# Patient Record
Sex: Male | Born: 1970 | Race: White | Hispanic: No | Marital: Married | Smoking: Never smoker
Health system: Southern US, Community
[De-identification: ages and names within clinical notes are randomized; demographics above are authoritative.]

## PROBLEM LIST (undated history)

## (undated) DIAGNOSIS — I10 Essential (primary) hypertension: Secondary | ICD-10-CM

---

## 2012-12-11 ENCOUNTER — Ambulatory Visit: Payer: Self-pay | Admitting: Emergency Medicine

## 2013-01-04 ENCOUNTER — Encounter: Payer: Self-pay | Admitting: Family Medicine

## 2013-01-07 ENCOUNTER — Encounter: Payer: Self-pay | Admitting: Family Medicine

## 2014-01-15 ENCOUNTER — Ambulatory Visit: Payer: Self-pay | Admitting: Family Medicine

## 2014-11-21 ENCOUNTER — Ambulatory Visit
Admit: 2014-11-21 | Disposition: A | Discharge status: Home / Self Care | Payer: Self-pay | Attending: Internal Medicine | Admitting: Internal Medicine

## 2016-11-01 IMAGING — CR DG CHEST 2V
2 series · 2 of 2 positions shown · non-contrast
Comparison: None.

CLINICAL DATA: Chest tightness with left upper chest and arm pain
today. Initial encounter.

EXAM:
CHEST  2 VIEW

[chest pa]
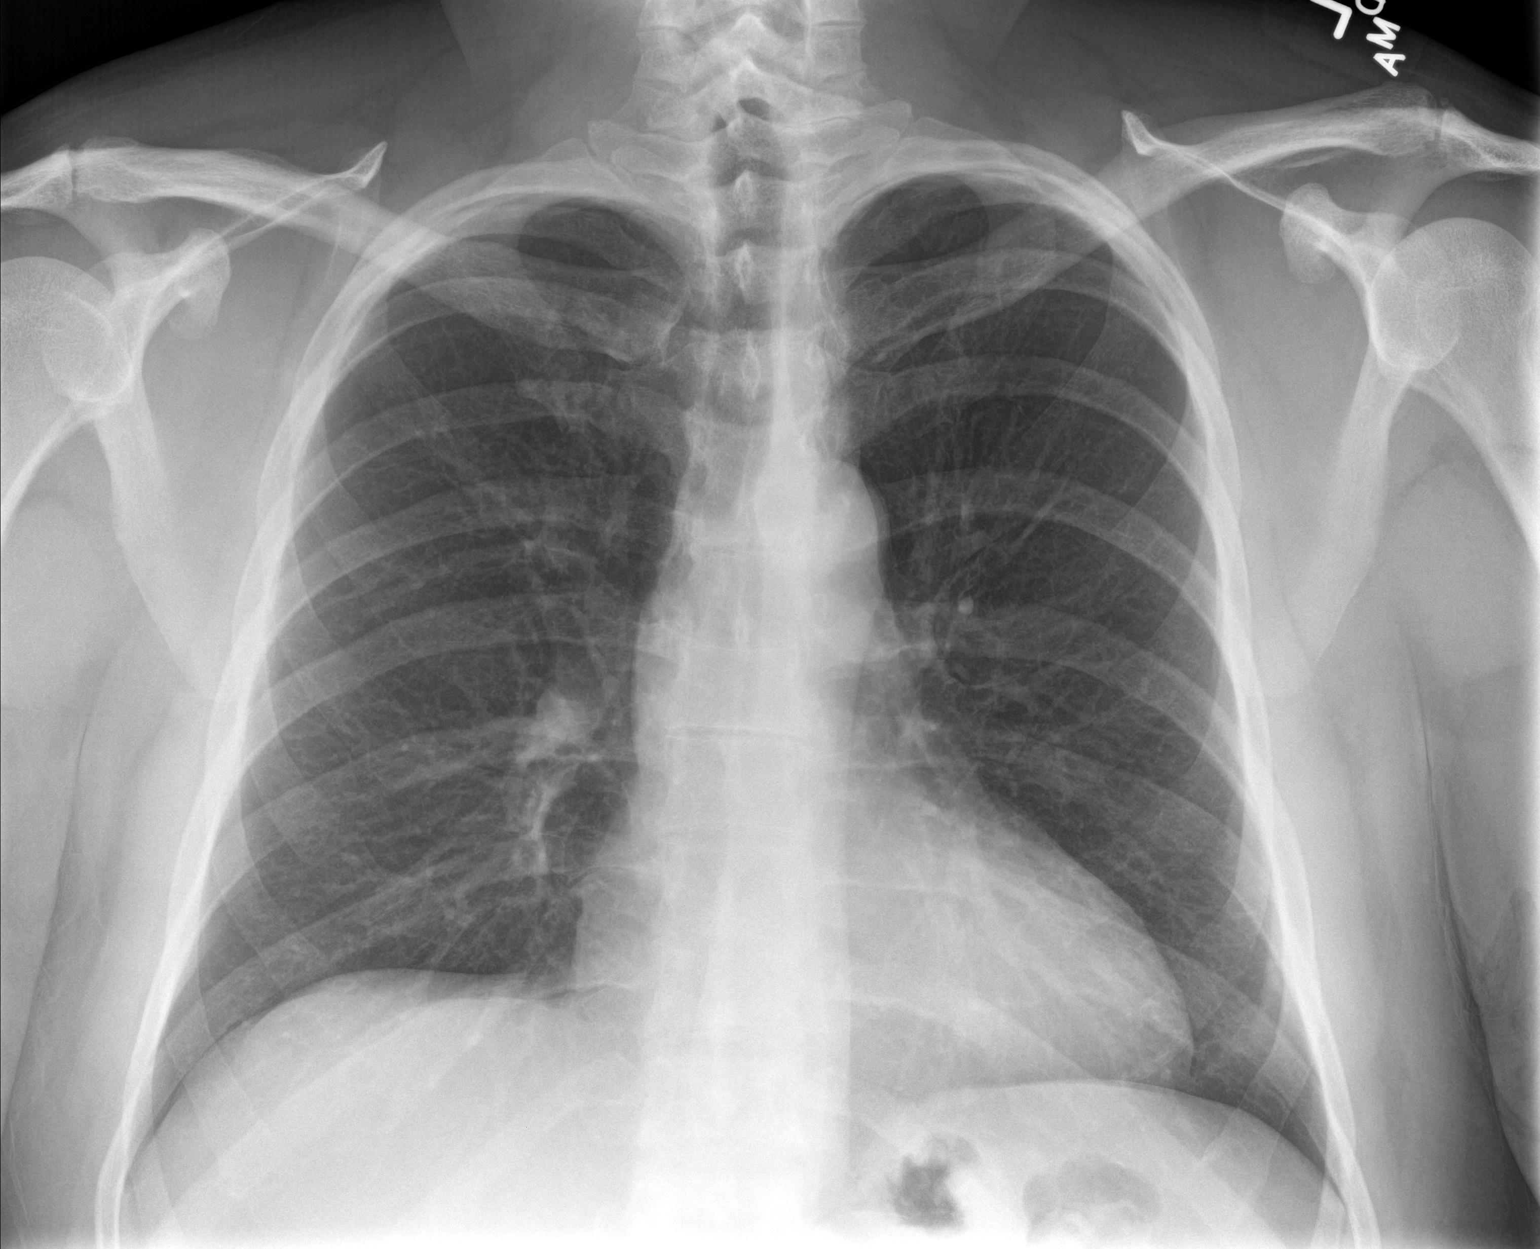

[chest lat]
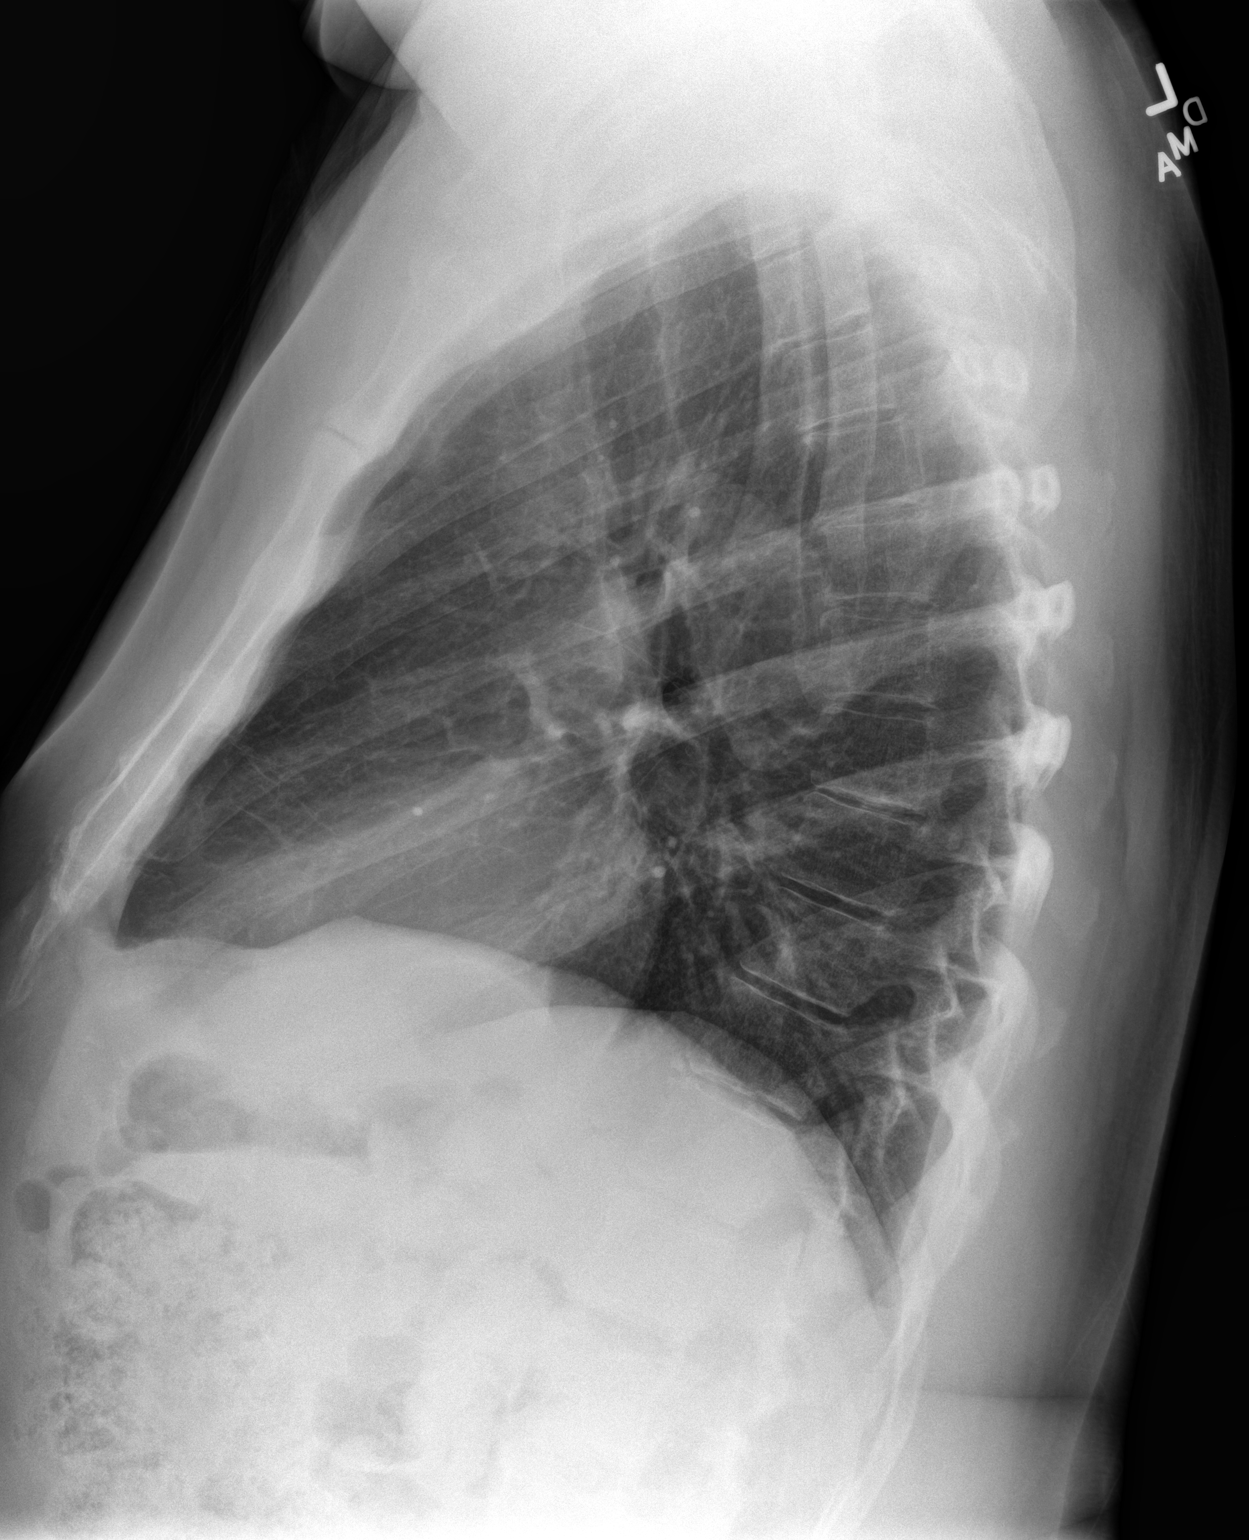

[2 of 2 positions shown; findings below may reference images not displayed]

FINDINGS: The heart size and mediastinal contours are normal. The lungs are
clear. There is no pleural effusion or pneumothorax. No acute
osseous findings are identified.
IMPRESSION: No active cardiopulmonary process.

## 2018-12-28 ENCOUNTER — Other Ambulatory Visit: Payer: Self-pay

## 2018-12-28 ENCOUNTER — Ambulatory Visit
Admission: EM | Admit: 2018-12-28 | Discharge: 2018-12-28 | Disposition: A | Discharge status: Home / Self Care | Payer: BLUE CROSS/BLUE SHIELD | Attending: Family Medicine | Admitting: Family Medicine

## 2018-12-28 ENCOUNTER — Encounter: Payer: Self-pay | Admitting: Emergency Medicine

## 2018-12-28 DIAGNOSIS — I10 Essential (primary) hypertension: Secondary | ICD-10-CM

## 2018-12-28 DIAGNOSIS — H1032 Unspecified acute conjunctivitis, left eye: Secondary | ICD-10-CM | POA: Diagnosis not present

## 2018-12-28 HISTORY — DX: Essential (primary) hypertension: I10

## 2018-12-28 MED ORDER — POLYMYXIN B-TRIMETHOPRIM 10000-0.1 UNIT/ML-% OP SOLN: 1.0000 [drp] | OPHTHALMIC | 0 refills | Status: AC

## 2018-12-28 NOTE — ED Provider Notes (Signed)
[address], [address] [place], [place] [address] [phone]   Name: Fred Dunn DOB: November 11, 1970 MRN: [id number] CSN: [id number] PCP: Patient, No Pcp Per  Arrival date and time:  12/28/18 1123  Chief Complaint:  Eye Problem  NOTE: Prior to seeing the patient today, I have reviewed the triage nursing documentation and vital signs. Clinical staff has updated patient's PMH/PSHx, current medication list, and drug allergies/intolerances to ensure comprehensive history available to assist in medical decision making.   History:   HPI: Fred Dunn is a 48 y.o. male who presents today with complaints of LEFT eye pain and drainage that began earlier today. Patient notes that his eye is pruritic and he has been experiencing increased tearing. No photophobia. Patient woke this morning with some mild yellow exudative crusting. No visual changes. No eye injury. Patient endorses rubbing his eye more as of late. No recent sinus or respiratory issues. Patient describes his eye as being "tender and scratchy" feeling.  Visual Acuity: Right Eye Distance: 20/20 corrected Left Eye Distance: 20/25 corrected Bilateral Distance: 20/20 corrected   Past Medical History:  Diagnosis Date  . Hypertension     History reviewed. No pertinent surgical history.  Family History  Problem Relation Age of Onset  . Hypertension Mother   . Hypertension Father     Social History   Socioeconomic History  . Marital status: Married    Spouse name: Not on file  . Number of children: Not on file  . Years of education: Not on file  . Highest education level: Not on file  Occupational History  . Not on file  Social Needs  . Financial resource strain: Not on file  . Food insecurity:    Worry: Not on file    Inability: Not on file  . Transportation needs:    Medical: Not on file    Non-medical: Not on file  Tobacco Use  . Smoking status: Never Smoker  . Smokeless tobacco: Never Used   Substance and Sexual Activity  . Alcohol use: Yes  . Drug use: Never  . Sexual activity: Not on file  Lifestyle  . Physical activity:    Days per week: Not on file    Minutes per session: Not on file  . Stress: Not on file  Relationships  . Social connections:    Talks on phone: Not on file    Gets together: Not on file    Attends religious service: Not on file    Active member of club or organization: Not on file    Attends meetings of clubs or organizations: Not on file    Relationship status: Not on file  . Intimate partner violence:    Fear of current or ex partner: Not on file    Emotionally abused: Not on file    Physically abused: Not on file    Forced sexual activity: Not on file  Other Topics Concern  . Not on file  Social History Narrative  . Not on file    There are no active problems to display for this patient.   Home Medications:    Current Meds  Medication Sig  . amLODipine (NORVASC) 10 MG tablet   . atorvastatin (LIPITOR) 40 MG tablet   . carvedilol (COREG) 25 MG tablet   . lisinopril-hydrochlorothiazide (ZESTORETIC) 20-25 MG tablet     Allergies:   Patient has no known allergies.  Review of Systems (ROS): Review of Systems  Constitutional: Negative for chills and fever.  HENT: Negative for congestion, ear pain and sinus pain.   Eyes: Positive for pain ("tender and scratchy"), discharge, redness and itching. Negative for photophobia and visual disturbance.  Respiratory: Negative for cough and shortness of breath.   Cardiovascular: Negative for chest pain and palpitations.  Allergic/Immunologic: Negative for environmental allergies (denies seasonal allergies).  Hematological: Negative for adenopathy.     Physical Exam:  Triage Vital Signs ED Triage Vitals  Enc Vitals Group     BP 12/28/18 1139 (!) 162/100     Pulse Rate 12/28/18 1139 75     Resp 12/28/18 1139 16     Temp 12/28/18 1139 98.1 F (36.7 C)     Temp Source 12/28/18 1139 Oral      SpO2 12/28/18 1139 98 %     Weight 12/28/18 1136 280 lb (127 kg)     Height 12/28/18 1136  (1.753 m)     Head Circumference --      Peak Flow --      Pain Score 12/28/18 1136 3     Pain Loc --      Pain Edu? --      Excl. in GC? --     Physical Exam  Constitutional: He is oriented to person, place, and time and well-developed, well-nourished, and in no distress.  HENT:  Head: Normocephalic and atraumatic.  Right Ear: Hearing, tympanic membrane, external ear and ear canal normal.  Left Ear: Hearing, tympanic membrane, external ear and ear canal normal.  Nose: Nose normal. No mucosal edema, rhinorrhea or sinus tenderness.  Mouth/Throat: Uvula is midline, oropharynx is clear and moist and mucous membranes are normal.  Eyes: Pupils are equal, round, and reactive to light. EOM are normal. Lids are everted and swept, no foreign bodies found. Right eye exhibits no discharge, no exudate and no hordeolum. No foreign body present in the right eye. Left eye exhibits discharge (excessive tearing) and exudate (yellow exudative crusting noted to upper lashes). Left eye exhibits no hordeolum. No foreign body present in the left eye. Right conjunctiva is not injected. Right conjunctiva has no hemorrhage. Left conjunctiva is injected. Left conjunctiva has no hemorrhage. No scleral icterus. Right eye exhibits normal extraocular motion and no nystagmus. Left eye exhibits normal extraocular motion and no nystagmus.  Cardiovascular: Normal rate, regular rhythm, normal heart sounds and intact distal pulses. Exam reveals no gallop and no friction rub.  No murmur heard. Pulmonary/Chest: Effort normal and breath sounds normal. No respiratory distress. He has no wheezes. He has no rales.  Neurological: He is alert and oriented to person, place, and time.  Skin: Skin is warm and dry. No rash noted. No erythema.  Psychiatric: Mood, affect and judgment normal.  Nursing note and vitals reviewed.    Urgent  Care Treatments / Results:   LABS: PLEASE NOTE: all labs that were ordered this encounter are listed, however only abnormal results are displayed. Labs Reviewed - No data to display  EKG: -None  RADIOLOGY: No results found.  PRODEDURES: Procedures  MEDICATIONS RECEIVED THIS VISIT: Medications - No data to display  PERTINENT CLINICAL COURSE NOTES/UPDATES: No data to display   Initial Impression / Assessment and Plan / Urgent Care Course:    Fred Dunn is a 48 y.o. male who presents to [facility] today with complaints of Eye Problem  Pertinent labs & imaging results that were available during my care of the patient were personally reviewed by me and considered in my medical decision  making (see lab/imaging section of note for values and interpretations).  Exam reveals scleral erythema in the LEFT eye. (+) excessive tearing. External eye is tenderness. Patient notes sensation of eye being "scratchy". Conjunctiva appears injected. No visual changes. Consider allergic vs. bacterial conjunctivitis. Given yellow exudative crusting, coupled with reports of excessive rubbing, suspect some degree of bacterial involvement. Will cover with course of optic Polytrim QID x 5 days. Encouraged warm/cool compresses for symptomatic relief of eye irritation. May use Tylenol and/or Ibuprofen as needed for pain.  Discussed follow up with opthalmology within 1 week for re-evaluation if not fully resolved. I have reviewed the follow up and strict return precautions for any new or worsening symptoms. Patient is aware of symptoms that would be deemed urgent/emergent, and would thus require further evaluation either here or in the emergency department. At the time of discharge, he verbalized understanding and consent with the discharge plan as it was reviewed with him. All questions were fielded by provider and/or clinic staff prior to patient discharge.    Final Clinical Impressions(s) / Urgent  Care Diagnoses:   Final diagnoses:  Acute bacterial conjunctivitis of left eye    New Prescriptions:   Meds ordered this encounter  Medications  . trimethoprim-polymyxin b (POLYTRIM) ophthalmic solution    Sig: Place 1 drop into the left eye every 4 (four) hours. for the next 5 days.    Dispense:  10 mL    Refill:  0    Controlled Substance Prescriptions:  [place] Controlled Substance Registry consulted? Not Applicable  NOTE: This note was prepared using Dragon dictation software along with smaller phrase technology. Despite my best ability to proofread, there is the potential that transcriptional errors may still occur from this process, and are completely unintentional.     Verlee MonteGray, Riggins Cisek E, NP 12/28/18 1211

## 2018-12-28 NOTE — Discharge Instructions (Addendum)
It was very nice meeting you today in clinic. Thank you for entrusting me with your care.   As discussed, try to avoid rubbing your eye. Cool compresses may help with symptomatic relief.   Please utilize the medications that we discussed. Your prescriptions have been called in to your pharmacy.   Make arrangements to follow up with an eye doctor in 1 week for re-evaluation if not resolved, or sooner if you develop any progressive symptoms. If your symptoms/condition worsens, please seek follow up care either here or in the ER. Please remember, our [facility] providers are "right here with you" when you need [place].   Again, it was my pleasure to take care of you today. Thank you for choosing our clinic. I hope that you start to feel better quickly.   Quentin Mulling, MSN, APRN, FNP-C, CEN Advanced Practice Provider  [facility]

## 2018-12-28 NOTE — ED Triage Notes (Signed)
Patient c/o itchy and watery eye that started this morning.

## 2024-05-16 ENCOUNTER — Other Ambulatory Visit: Payer: Self-pay | Admitting: Nurse Practitioner

## 2024-05-16 ENCOUNTER — Other Ambulatory Visit: Payer: Self-pay

## 2024-05-16 DIAGNOSIS — I422 Other hypertrophic cardiomyopathy: Secondary | ICD-10-CM

## 2024-06-04 ENCOUNTER — Ambulatory Visit
Admission: RE | Admit: 2024-06-04 | Discharge: 2024-06-04 | Disposition: A | Discharge status: Home / Self Care | Payer: Self-pay | Source: Ambulatory Visit

## 2024-06-04 ENCOUNTER — Other Ambulatory Visit: Payer: Self-pay

## 2024-06-04 DIAGNOSIS — I422 Other hypertrophic cardiomyopathy: Secondary | ICD-10-CM

## 2024-06-04 MED ORDER — GADOBUTROL 1 MMOL/ML IV SOLN: 15.0000 mL | Freq: Once | INTRAVENOUS | Status: DC | PRN

## 2024-07-23 ENCOUNTER — Ambulatory Visit: Admission: RE | Admit: 2024-07-23 | Discharge: 2024-07-23

## 2024-07-23 DIAGNOSIS — I422 Other hypertrophic cardiomyopathy: Secondary | ICD-10-CM | POA: Insufficient documentation

## 2024-07-23 MED ORDER — GADOBUTROL 1 MMOL/ML IV SOLN
15.0000 mL | Freq: Once | INTRAVENOUS | Status: AC | PRN
  Administered 2024-07-23: 16:00:00 15 mL via INTRAVENOUS
